# Patient Record
Sex: Female | Born: 1935 | Race: White | Hispanic: No
Health system: Southern US, Community
[De-identification: ages and names within clinical notes are randomized; demographics above are authoritative.]

---

## 2004-01-25 ENCOUNTER — Ambulatory Visit: Payer: Self-pay | Admitting: Internal Medicine

## 2004-01-27 ENCOUNTER — Ambulatory Visit: Payer: Self-pay | Admitting: Internal Medicine

## 2004-07-30 ENCOUNTER — Ambulatory Visit: Payer: Self-pay

## 2005-01-22 ENCOUNTER — Emergency Department: Payer: Self-pay | Admitting: Internal Medicine

## 2005-09-26 ENCOUNTER — Ambulatory Visit: Payer: Self-pay | Admitting: Internal Medicine

## 2006-11-18 ENCOUNTER — Ambulatory Visit: Payer: Self-pay | Admitting: Internal Medicine

## 2007-11-19 ENCOUNTER — Ambulatory Visit: Payer: Self-pay | Admitting: Internal Medicine

## 2009-01-30 ENCOUNTER — Ambulatory Visit: Payer: Self-pay | Admitting: Internal Medicine

## 2010-02-21 ENCOUNTER — Ambulatory Visit: Payer: Self-pay | Admitting: Internal Medicine

## 2010-03-07 ENCOUNTER — Ambulatory Visit: Payer: Self-pay | Admitting: Internal Medicine

## 2012-05-24 ENCOUNTER — Inpatient Hospital Stay: Payer: Self-pay | Admitting: Specialist

## 2012-05-24 ENCOUNTER — Ambulatory Visit: Payer: Self-pay | Admitting: Orthopedic Surgery

## 2012-05-24 LAB — BASIC METABOLIC PANEL
Anion Gap: 6 — ABNORMAL LOW (ref 7–16)
Calcium, Total: 8.9 mg/dL (ref 8.5–10.1)
Chloride: 107 mmol/L (ref 98–107)
Co2: 28 mmol/L (ref 21–32)
EGFR (African American): >60
Glucose: 119 mg/dL — ABNORMAL HIGH (ref 65–99)
Osmolality: 284 (ref 275–301)
Potassium: 3.9 mmol/L (ref 3.5–5.1)

## 2012-05-24 LAB — CBC
HCT: 38.3 % (ref 35.0–47.0)
HGB: 13 g/dL (ref 12.0–16.0)
MCH: 32.7 pg (ref 26.0–34.0)
MCHC: 33.9 g/dL (ref 32.0–36.0)
Platelet: 153 10*3/uL (ref 150–440)
RBC: 3.97 10*6/uL (ref 3.80–5.20)
RDW: 13 % (ref 11.5–14.5)
WBC: 8.8 10*3/uL (ref 3.6–11.0)

## 2012-05-24 LAB — URINALYSIS, COMPLETE
Bacteria: NONE SEEN
Blood: NEGATIVE
Glucose,UR: NEGATIVE mg/dL (ref 0–75)
Leukocyte Esterase: NEGATIVE
Ph: 8 (ref 4.5–8.0)
RBC,UR: 3 /HPF (ref 0–5)
Squamous Epithelial: NONE SEEN
WBC UR: 1 /HPF (ref 0–5)

## 2012-05-24 LAB — PROTIME-INR
INR: 0.9
Prothrombin Time: 12.4 secs (ref 11.5–14.7)

## 2012-05-25 LAB — BASIC METABOLIC PANEL
Anion Gap: 6 — ABNORMAL LOW (ref 7–16)
BUN: 16 mg/dL (ref 7–18)
Calcium, Total: 7.8 mg/dL — ABNORMAL LOW (ref 8.5–10.1)
Co2: 26 mmol/L (ref 21–32)
EGFR (Non-African Amer.): >60
Glucose: 115 mg/dL — ABNORMAL HIGH (ref 65–99)
Osmolality: 278 (ref 275–301)
Potassium: 4 mmol/L (ref 3.5–5.1)
Sodium: 138 mmol/L (ref 136–145)

## 2012-05-25 LAB — CBC WITH DIFFERENTIAL/PLATELET
Basophil %: 0.3 %
HGB: 10.5 g/dL — ABNORMAL LOW (ref 12.0–16.0)
MCH: 32.5 pg (ref 26.0–34.0)
MCHC: 33.7 g/dL (ref 32.0–36.0)
MCV: 97 fL (ref 80–100)
Monocyte #: 0.7 x10 3/mm (ref 0.2–0.9)
Monocyte %: 10.9 %
Neutrophil #: 4.7 10*3/uL (ref 1.4–6.5)
Platelet: 140 10*3/uL — ABNORMAL LOW (ref 150–440)
WBC: 6.7 10*3/uL (ref 3.6–11.0)

## 2012-05-26 LAB — BASIC METABOLIC PANEL
BUN: 11 mg/dL (ref 7–18)
Chloride: 105 mmol/L (ref 98–107)
Co2: 25 mmol/L (ref 21–32)
Creatinine: 0.75 mg/dL (ref 0.60–1.30)
Glucose: 129 mg/dL — ABNORMAL HIGH (ref 65–99)
Osmolality: 273 (ref 275–301)
Potassium: 4 mmol/L (ref 3.5–5.1)

## 2012-05-26 LAB — CBC WITH DIFFERENTIAL/PLATELET
Basophil #: 0 10*3/uL (ref 0.0–0.1)
Basophil %: 0.1 %
Lymphocyte %: 9.3 %
MCV: 96 fL (ref 80–100)
Neutrophil %: 77.9 %
Platelet: 113 10*3/uL — ABNORMAL LOW (ref 150–440)
RDW: 13 % (ref 11.5–14.5)
WBC: 7.7 10*3/uL (ref 3.6–11.0)

## 2012-05-26 LAB — URINE CULTURE

## 2012-05-27 LAB — CBC WITH DIFFERENTIAL/PLATELET
Eosinophil #: 0.1 10*3/uL (ref 0.0–0.7)
Lymphocyte #: 1.1 10*3/uL (ref 1.0–3.6)
MCH: 32.3 pg (ref 26.0–34.0)
MCHC: 33.4 g/dL (ref 32.0–36.0)
MCV: 97 fL (ref 80–100)
Neutrophil #: 5 10*3/uL (ref 1.4–6.5)
Neutrophil %: 70.7 %
Platelet: 102 10*3/uL — ABNORMAL LOW (ref 150–440)
RBC: 2.63 10*6/uL — ABNORMAL LOW (ref 3.80–5.20)
RDW: 13.2 % (ref 11.5–14.5)

## 2012-06-01 ENCOUNTER — Encounter: Payer: Self-pay | Admitting: Internal Medicine

## 2012-06-13 ENCOUNTER — Encounter: Payer: Self-pay | Admitting: Internal Medicine

## 2012-06-16 LAB — CBC WITH DIFFERENTIAL/PLATELET
Basophil #: 0 10*3/uL (ref 0.0–0.1)
Eosinophil %: 3.7 %
Lymphocyte #: 0.9 10*3/uL — ABNORMAL LOW (ref 1.0–3.6)
Lymphocyte %: 15.9 %
MCHC: 33.2 g/dL (ref 32.0–36.0)
Monocyte #: 0.9 x10 3/mm (ref 0.2–0.9)
Monocyte %: 15.8 %
Neutrophil %: 64.1 %
Platelet: 256 10*3/uL (ref 150–440)
RBC: 3.28 10*6/uL — ABNORMAL LOW (ref 3.80–5.20)
RDW: 14.8 % — ABNORMAL HIGH (ref 11.5–14.5)
WBC: 5.7 10*3/uL (ref 3.6–11.0)

## 2012-06-16 LAB — BASIC METABOLIC PANEL
Anion Gap: 4 — ABNORMAL LOW (ref 7–16)
Calcium, Total: 7.9 mg/dL — ABNORMAL LOW (ref 8.5–10.1)
Creatinine: 0.73 mg/dL (ref 0.60–1.30)
Glucose: 119 mg/dL — ABNORMAL HIGH (ref 65–99)
Osmolality: 274 (ref 275–301)
Potassium: 3.7 mmol/L (ref 3.5–5.1)

## 2012-06-16 LAB — CLOSTRIDIUM DIFFICILE BY PCR

## 2012-07-30 ENCOUNTER — Ambulatory Visit: Payer: Self-pay | Admitting: Physician Assistant

## 2012-09-03 ENCOUNTER — Encounter: Payer: Self-pay | Admitting: Specialist

## 2012-09-13 ENCOUNTER — Encounter: Payer: Self-pay | Admitting: Specialist

## 2012-10-13 ENCOUNTER — Encounter: Payer: Self-pay | Admitting: Specialist

## 2012-12-21 ENCOUNTER — Other Ambulatory Visit: Payer: Self-pay

## 2012-12-21 LAB — LIPID PANEL
HDL Cholesterol: 74 mg/dL — ABNORMAL HIGH (ref 40–60)
Triglycerides: 75 mg/dL (ref 0–200)
VLDL Cholesterol, Calc: 15 mg/dL (ref 5–40)

## 2012-12-21 LAB — COMPREHENSIVE METABOLIC PANEL
Albumin: 3.7 g/dL (ref 3.4–5.0)
Alkaline Phosphatase: 144 U/L — ABNORMAL HIGH (ref 50–136)
Anion Gap: 2 — ABNORMAL LOW (ref 7–16)
BUN: 19 mg/dL — ABNORMAL HIGH (ref 7–18)
Calcium, Total: 8.8 mg/dL (ref 8.5–10.1)
EGFR (African American): >60
Glucose: 89 mg/dL (ref 65–99)
Osmolality: 276 (ref 275–301)
SGPT (ALT): 14 U/L (ref 12–78)
Sodium: 137 mmol/L (ref 136–145)

## 2012-12-21 LAB — CBC WITH DIFFERENTIAL/PLATELET
Basophil #: 0.1 10*3/uL (ref 0.0–0.1)
Basophil %: 1 %
Eosinophil #: 0.2 10*3/uL (ref 0.0–0.7)
Eosinophil %: 3.5 %
HCT: 37.4 % (ref 35.0–47.0)
Lymphocyte #: 1.6 10*3/uL (ref 1.0–3.6)
MCHC: 34.2 g/dL (ref 32.0–36.0)
Monocyte %: 8 %
Neutrophil #: 3.6 10*3/uL (ref 1.4–6.5)
Neutrophil %: 60 %
Platelet: 175 10*3/uL (ref 150–440)
RBC: 4.05 10*6/uL (ref 3.80–5.20)
RDW: 15.3 % — ABNORMAL HIGH (ref 11.5–14.5)

## 2012-12-21 LAB — TSH: Thyroid Stimulating Horm: 0.865 u[IU]/mL

## 2013-05-11 ENCOUNTER — Emergency Department: Payer: Self-pay | Admitting: Emergency Medicine

## 2014-07-03 IMAGING — CR DG FEMUR 2V*R*
1 series · 4 of 4 positions shown · non-contrast
Comparison: none

REASON FOR EXAM: fall, right thigh pain
COMMENTS:

PROCEDURE:     DXR - DXR FEMUR RIGHT  - May 24, 2012  [DATE]
RESULT:     Angulated medially comminuted fracture the distal femoral shaft
is present. Prior tibial surgery.

[Series 1: ap · 0.17mm/px · 4 of 4 slices shown]
[im 1/4]
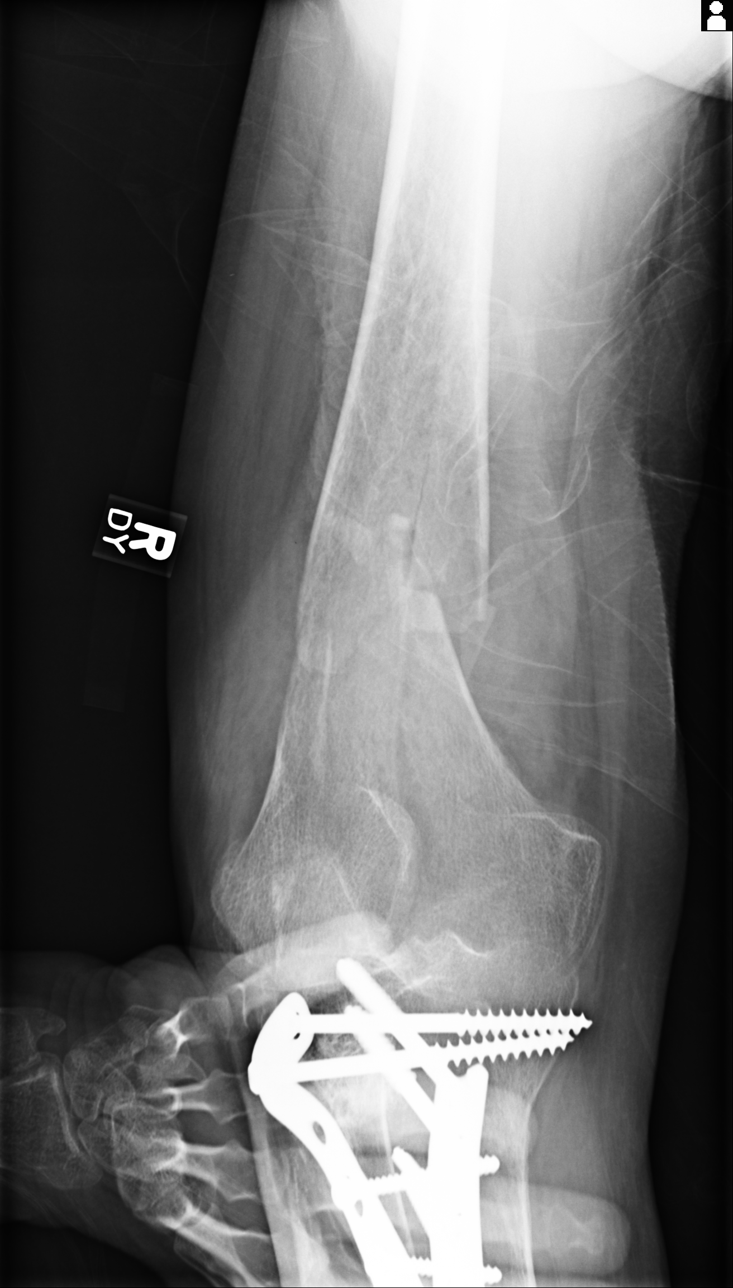
[im 2/4]
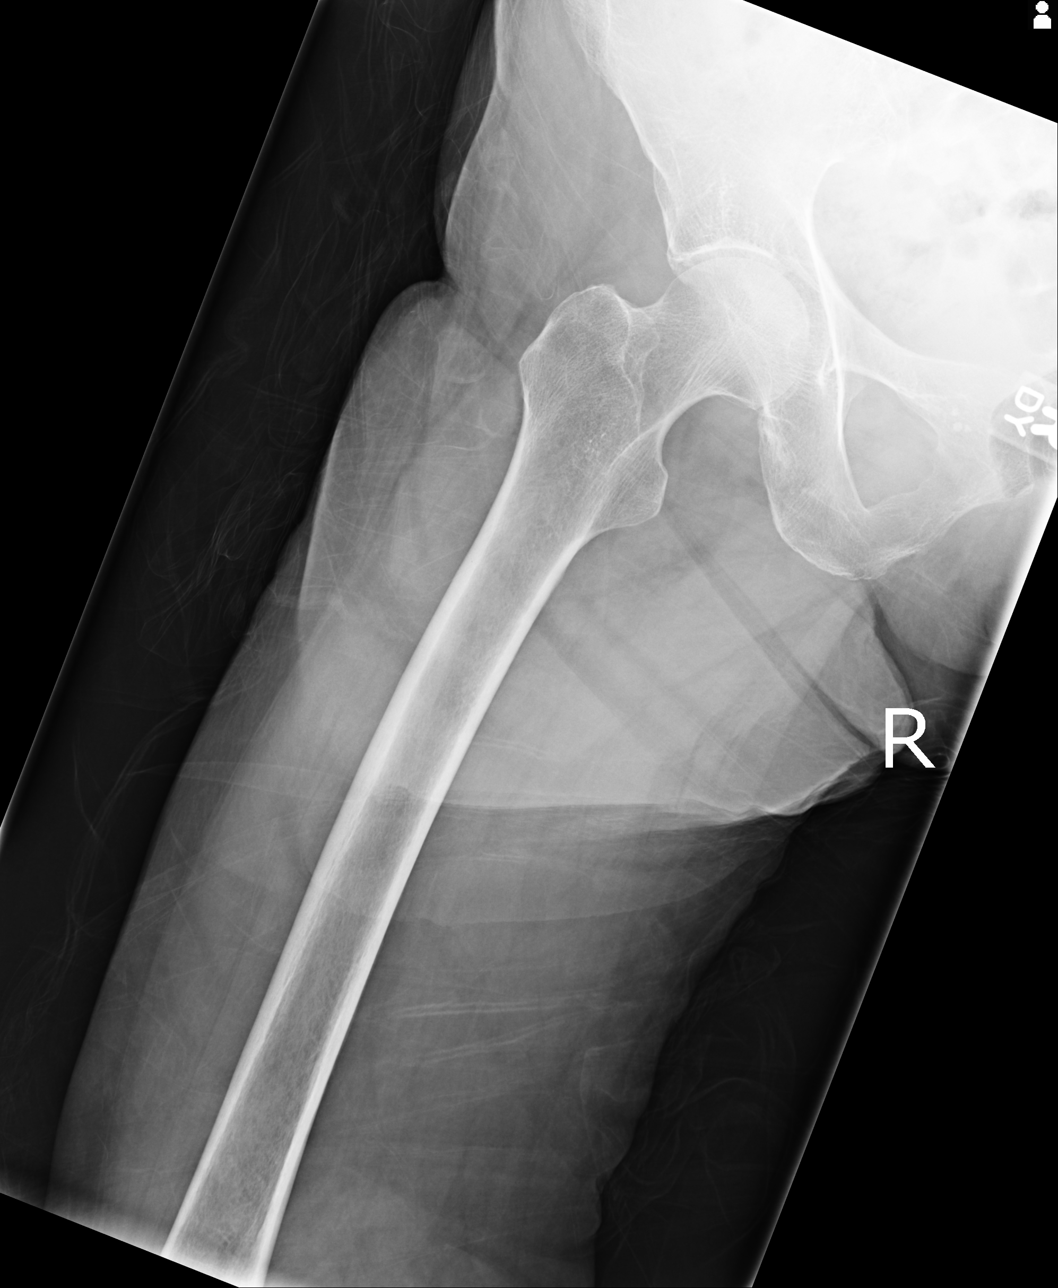
[im 3/4]
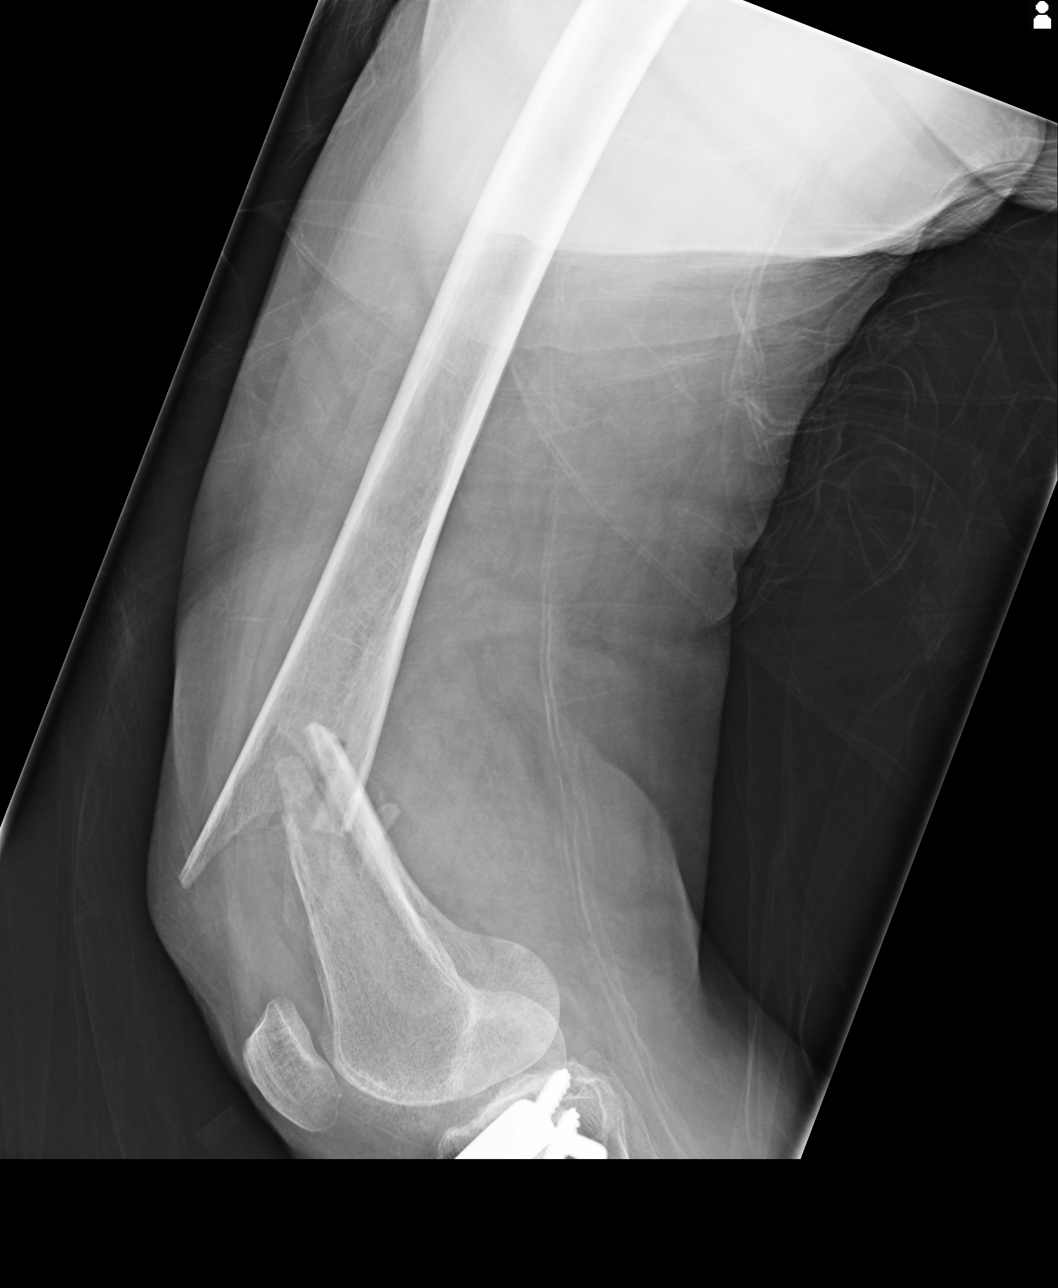
[im 4/4]
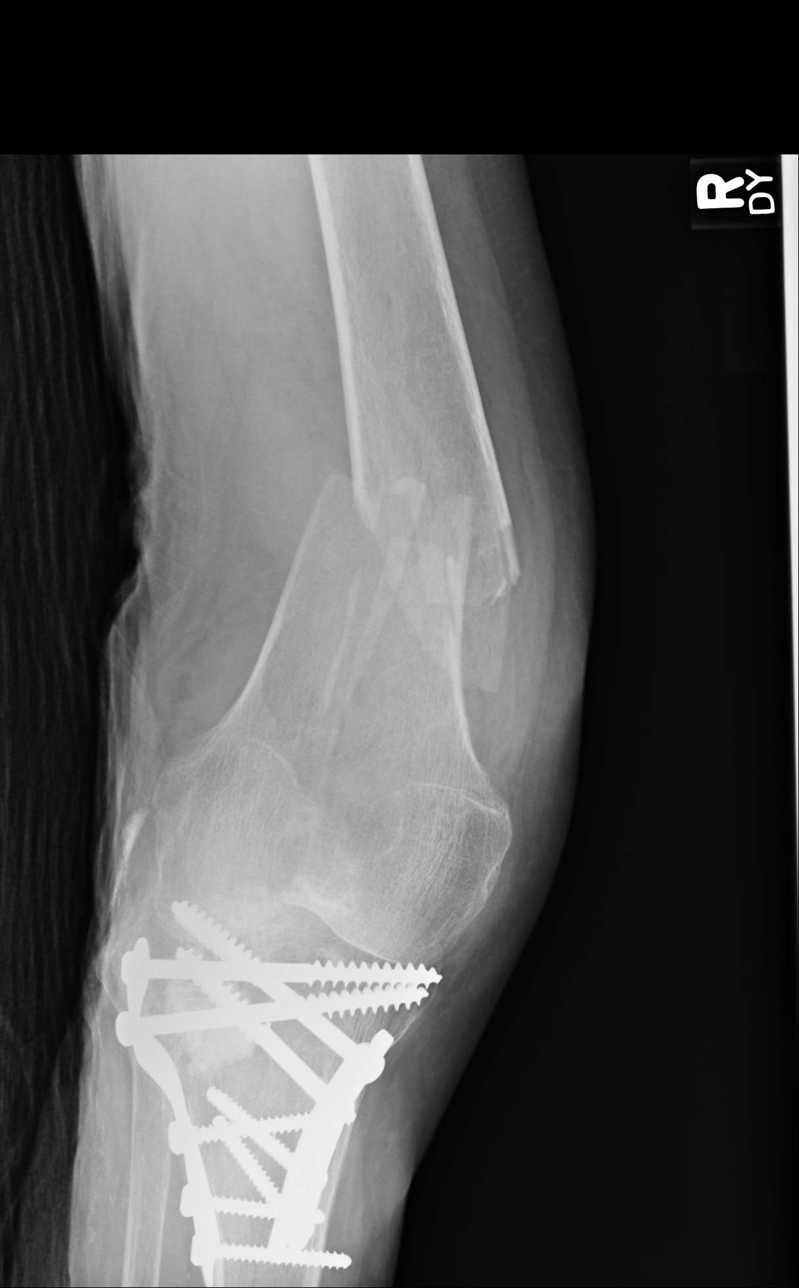

[4 of 4 positions shown; findings below may reference images not displayed]

IMPRESSION: Severe angulated comminuted distal femoral shaft fracture.

## 2014-07-04 IMAGING — CR DG FEMUR 2V*R*
1 series · 4 of 4 positions shown · non-contrast
Comparison: none

REASON FOR EXAM: post op
COMMENTS:

PROCEDURE:     DXR - DXR FEMUR RIGHT  - May 25, 2012  [DATE]
RESULT:     Comparison: 05/24/2012

[Series 1: ap · 0.17mm/px · 4 of 4 slices shown]
[im 1/4]
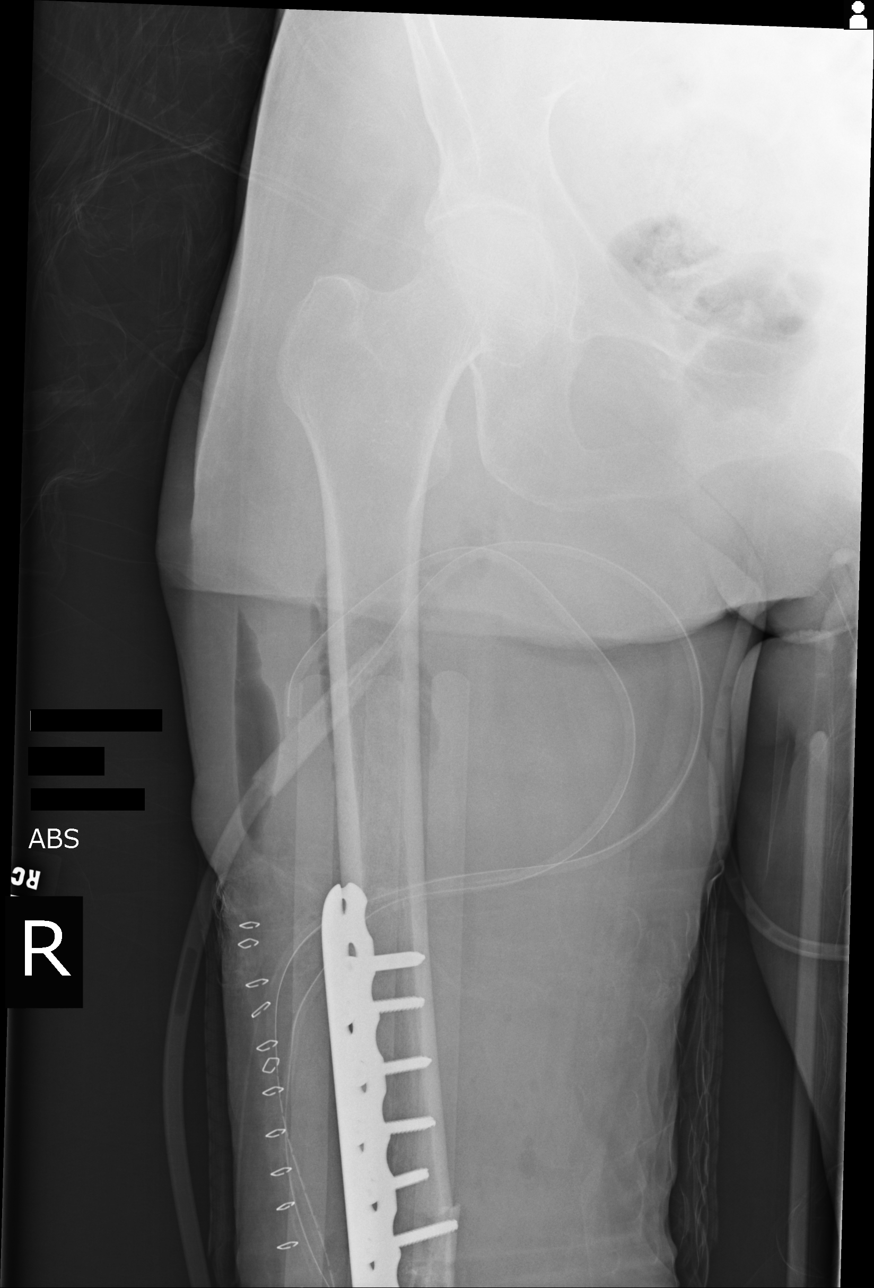
[im 2/4]
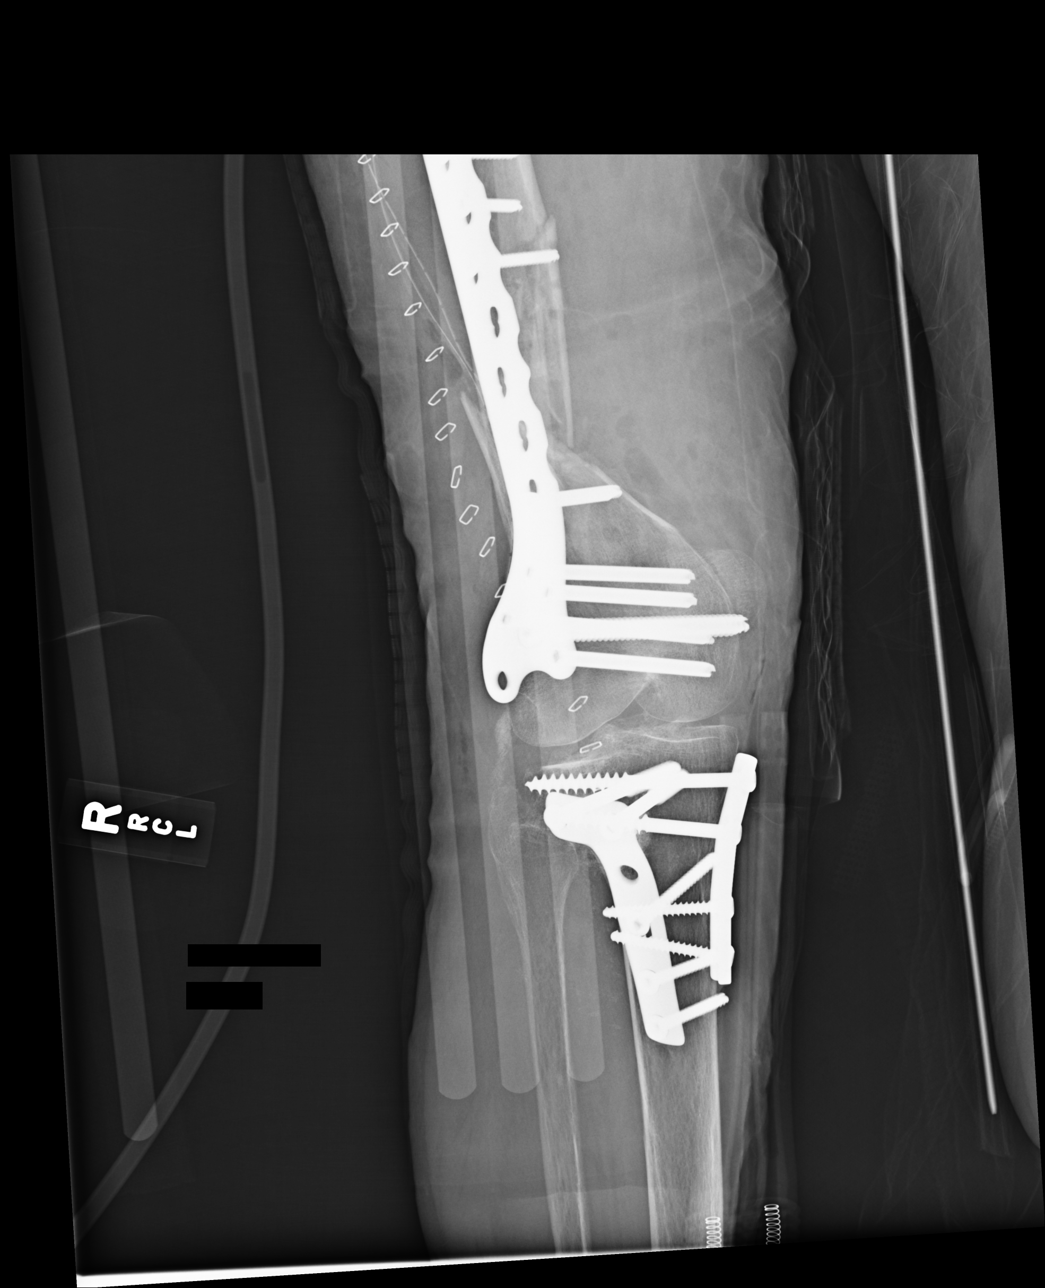
[im 3/4]
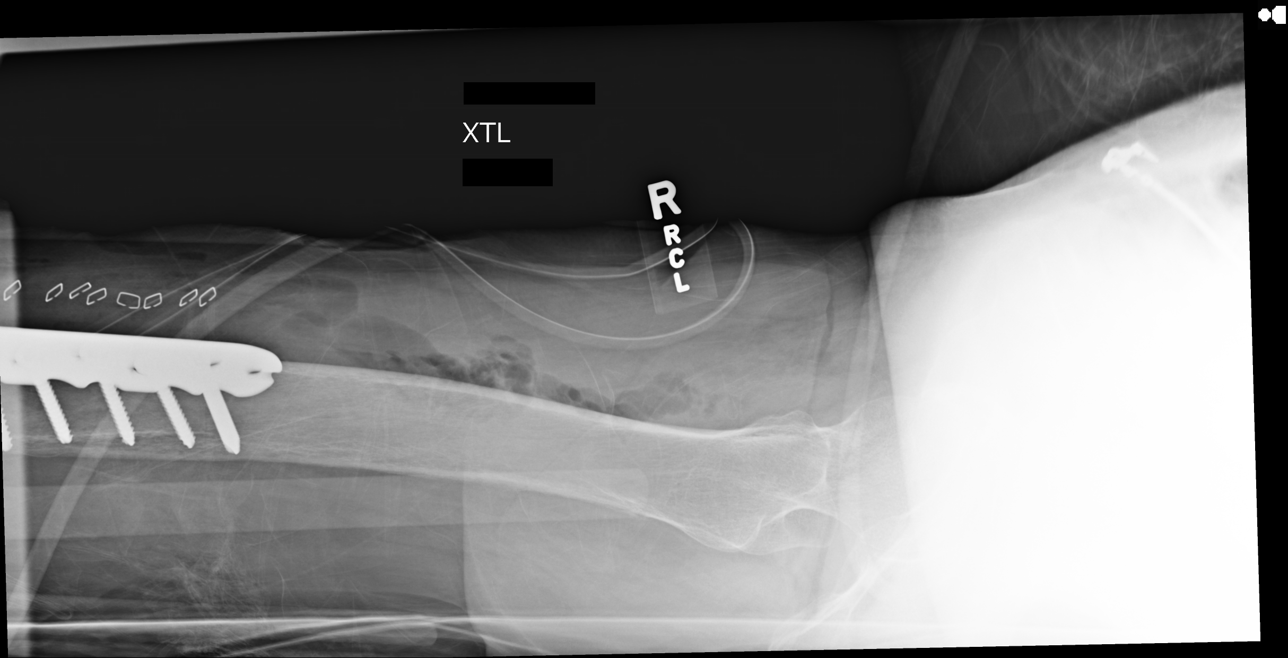
[im 4/4]
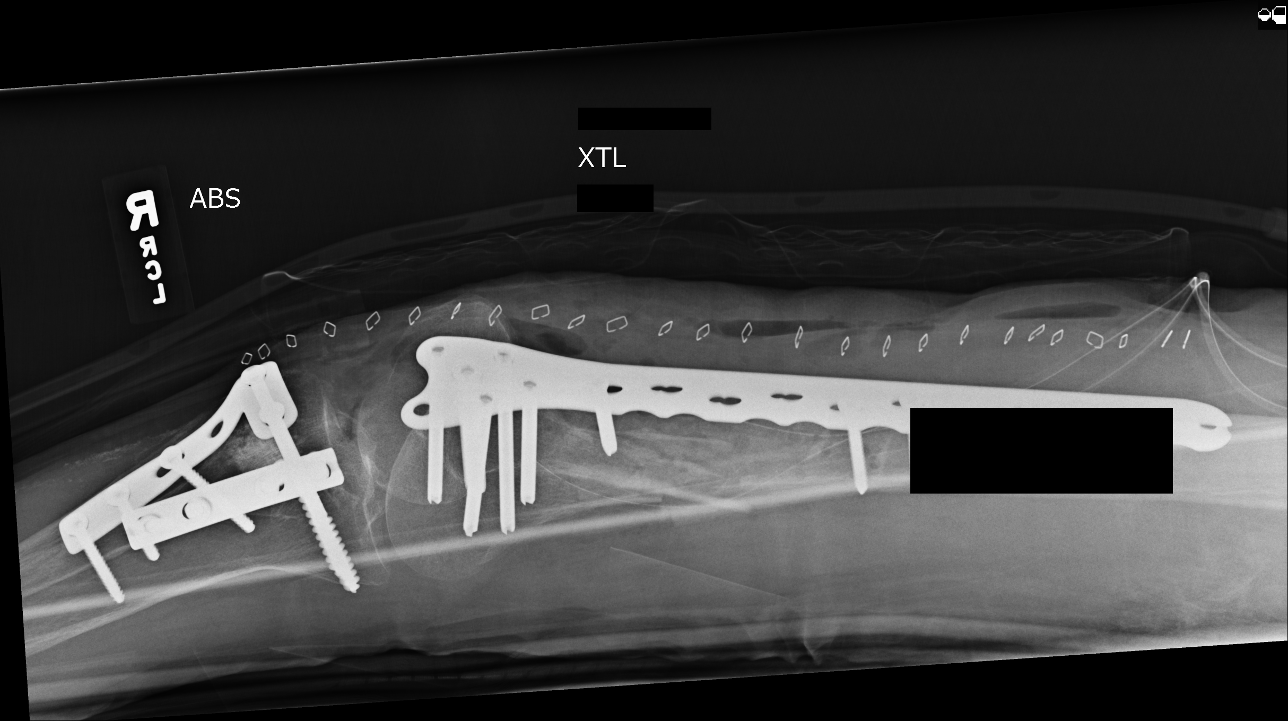

[4 of 4 positions shown; findings below may reference images not displayed]

FINDINGS: Lateral cortical position plate and screw seen traversing the distal femur
fracture. Internal fixation hardware demonstrated in the proximal tibia as
well. Skin staples and surgical drain are present. Soft tissue gas is likely
postoperative.
IMPRESSION: ORIF of the distal femur fracture.

[REDACTED]

## 2014-07-04 IMAGING — CR DG C-ARM 1-60 MIN
3 series · 3 of 3 positions shown · non-contrast
Comparison: none

REASON FOR EXAM: distal femur Fracture reduction/fixation
COMMENTS:

PROCEDURE:     DXR - DXR C-ARM WITH SPOT IMAGES  - May 25, 2012  [DATE]
RESULT:     Comparison: 05/24/2012

[[id] (1 of 3)]
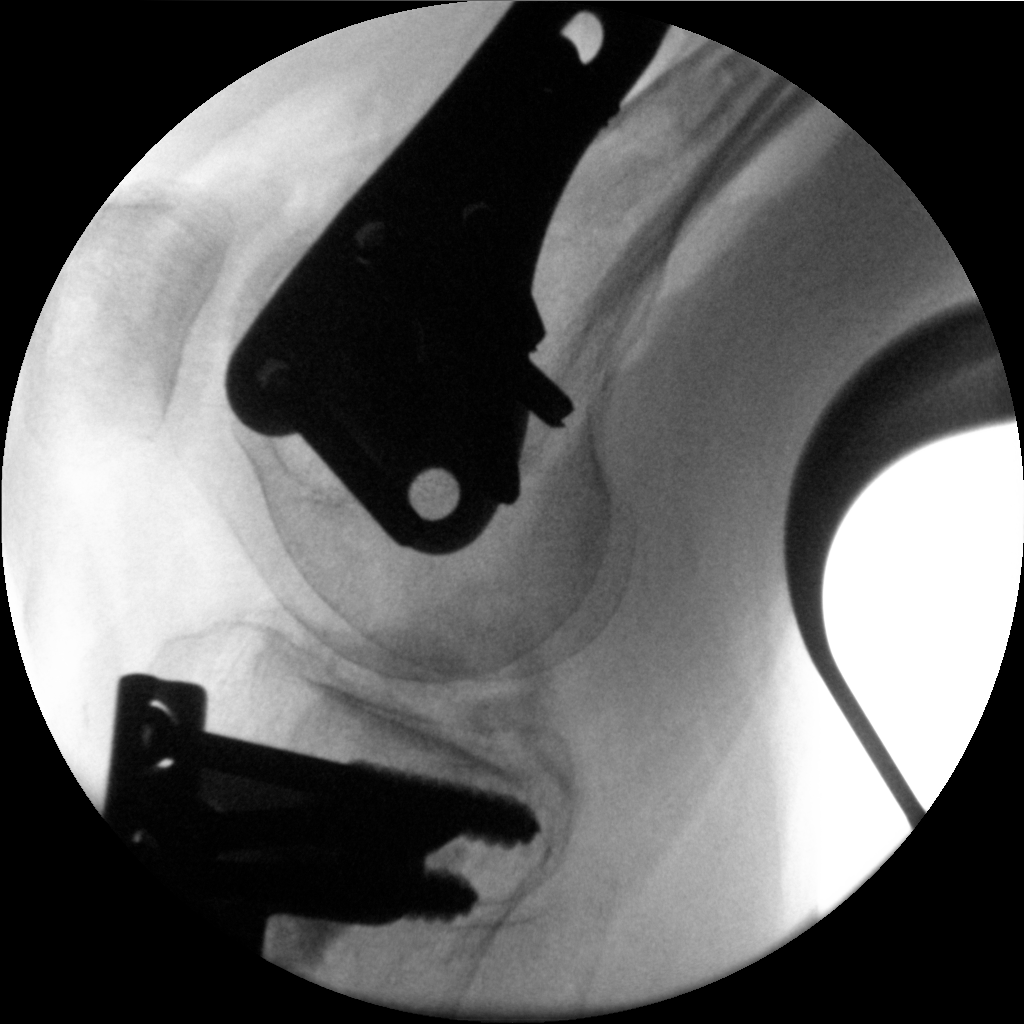

[[id] (2 of 3)]
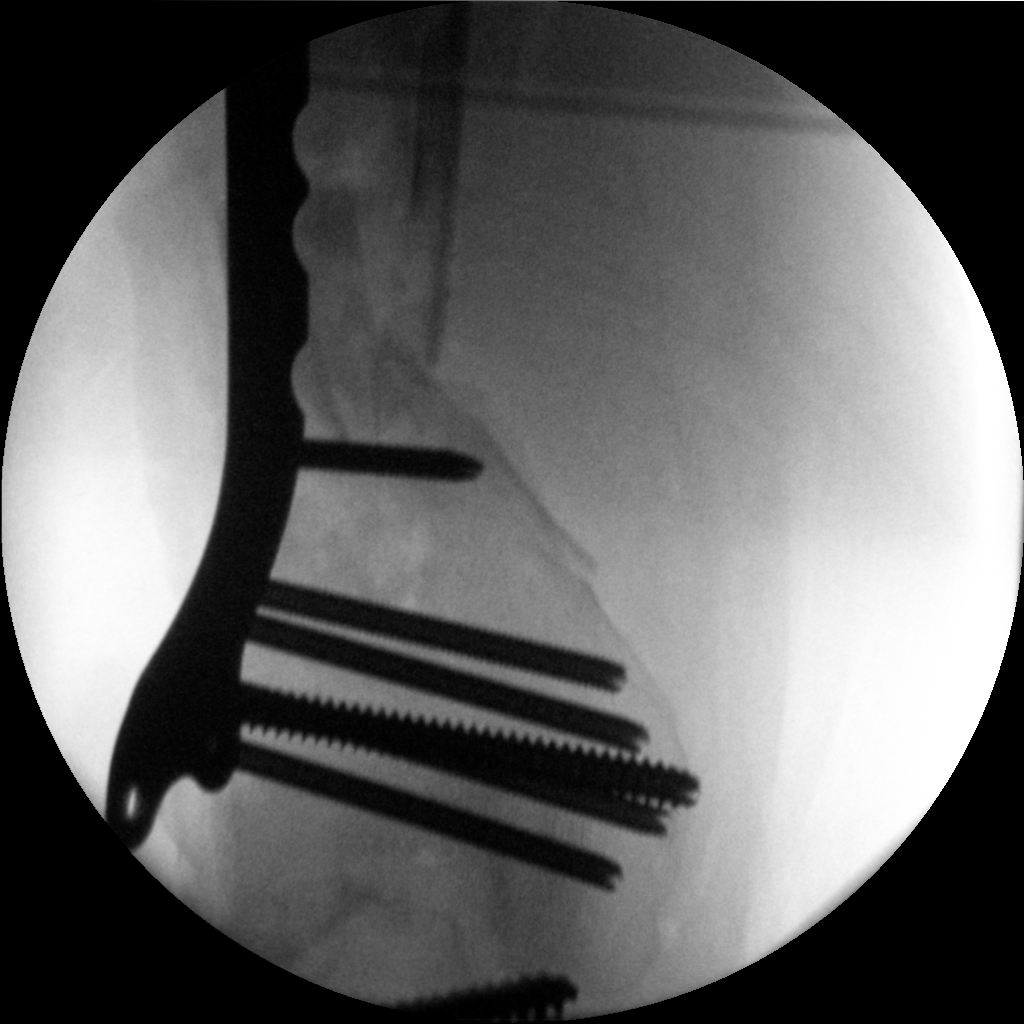

[[id] (3 of 3)]
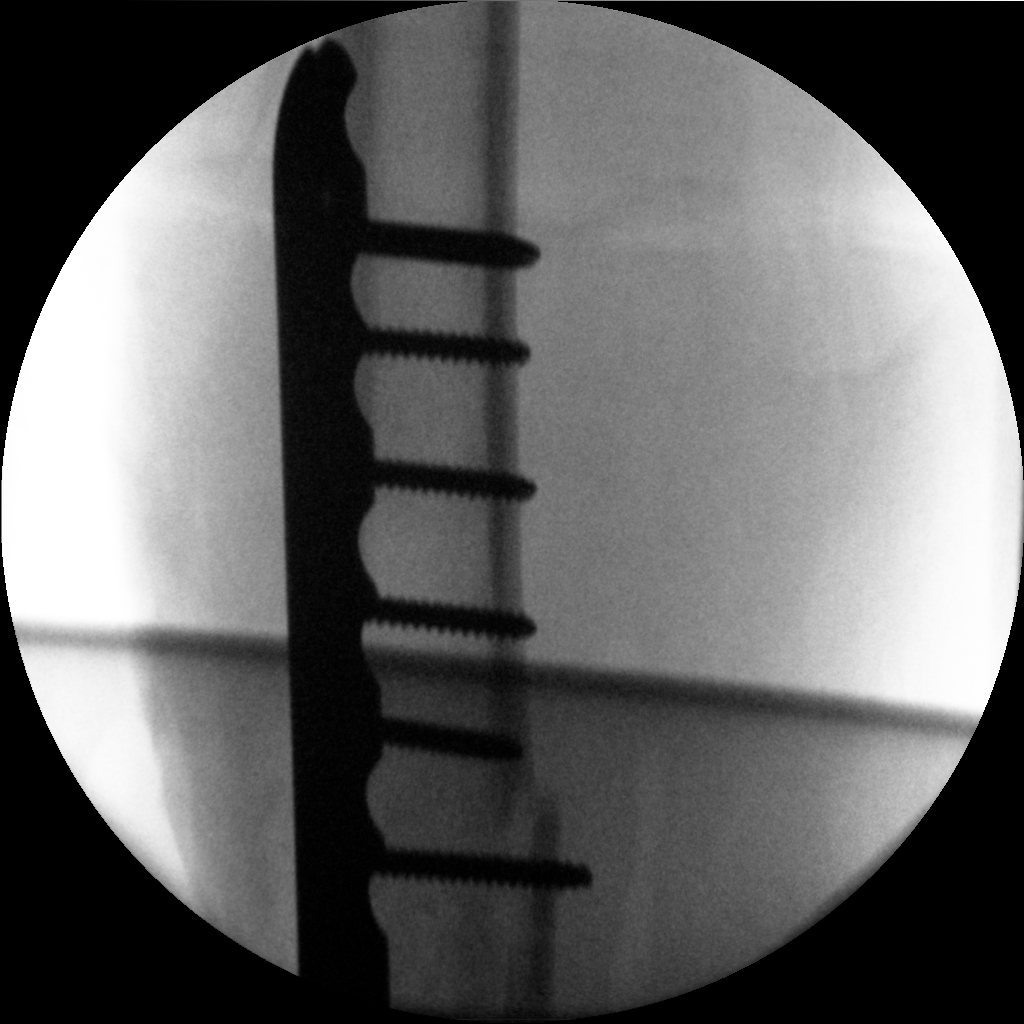

[3 of 3 positions shown; findings below may reference images not displayed]

FINDINGS: Three fluoroscopic images were submitted from intraoperative procedure.
Lateral cortical fixation plate and screws are seen traversing the distal
femur fracture. The remainder of the interpretation is deferred to the
performing clinician.
IMPRESSION: Please see above.

[REDACTED]

## 2014-08-05 NOTE — Discharge Summary (Signed)
PATIENT NAME:  [name] [name]:  [id number] DATE OF BIRTH:  Jun 02, 1928  DATE OF ADMISSION:  05/24/2012 DATE OF DISCHARGE:  05/28/2012  FINAL DIAGNOSES: 1. Comminuted displaced right distal femoral shaft fracture with intercondylar extension. 2. Osteoporosis.  OPERATIONS: On 05/25/2012, open reduction and internal fixation of right distal femur fracture with long plate and screws.   COMPLICATIONS: None.   CONSULTATIONS: Aram BeechamJeffrey Sparks, MD   DISCHARGE MEDICATIONS: 1. Benadryl p.r.n.  2. Norco 5/325 mg 1 to 2 q. 4 hours p.r.n. pain.  3. Fosamax 70 mg weekly.  4. Calcium 1 p.o. b.i.d.  5. Enteric-coated aspirin 1 p.o. b.i.d.  6. Iron 1 p.o. every day.    HISTORY OF PRESENT ILLNESS: The patient is an 81 year old female who stumbled and fell at home on the day of admission. She was brought to the Emergency Room where exam and x-rays revealed a comminuted distal femur fracture with intercondylar extension. She was admitted for operative fixation of the fracture.   PAST MEDICAL HISTORY: Illnesses as above.   MEDICATIONS: As above.   ALLERGIES: No known drug allergies.  REVIEW OF SYSTEMS: Unremarkable.   FAMILY HISTORY: Unremarkable.   SOCIAL HISTORY: The patient lives in her own home. Does not smoke or drink.   PHYSICAL EXAMINATION: The patient was alert and cooperative. Right leg was tender and swollen, distal femur. Neurovascular status was good and the skin was intact. There was pain with any movement.   LABORATORY DATA: On admission was satisfactory.   HOSPITAL COURSE: On 05/25/2012, the patient underwent open reduction and internal fixation of the right femur fracture. Postoperatively, she did well. Hemoglobin dropped to 8.5, but she was asymptomatic. She was gradually ambulated, touchdown weight-bearing only. She is stable and ready for skilled nursing transfer today. She will be seen in my office in 2 weeks for exam and x-ray.  ____________________________ Valinda HoarHoward E. Tereza Gilham,  MD hem:sb D: 05/28/2012 10:40:06 ET T: 05/28/2012 10:47:05 ET JOB#: [id number]  cc: Valinda HoarHoward E. Marla Pouliot, MD, <Dictator> Lyndon CodeFozia M. Khan, MD Valinda HoarHOWARD E Karrin Eisenmenger MD ELECTRONICALLY SIGNED 05/29/2012 15:12

## 2014-08-05 NOTE — Op Note (Signed)
PATIENT NAME:  Revonda StandardLLISON, Vicki [name] DATE OF BIRTH:  Sep 16, 1928  DATE OF PROCEDURE:  05/25/2012  PREOPERATIVE DIAGNOSIS: Comminuted, unstable, intracondylar  right distal femur fracture with displacement.   POSTOPERATIVE DIAGNOSIS: Comminuted, unstable, intracondylar  right distal femur fracture with displacement.  PROCEDURE PERFORMED: Open reduction internal fixation right distal femur fracture with intracondylar extension (Synthes 10-hole locking plate and screws).   SURGEON: Valinda HoarHoward E. Nihaal Friesen, M.D.   ANESTHESIA: Spinal.   COMPLICATIONS: None.   DRAINS: 2 Autovac.   ESTIMATED BLOOD LOSS: 100 mL   REPLACEMENT: None.   OPERATIVE PROCEDURE:  The patient was brought into the operating room, where she underwent satisfactory spinal anesthesia and was placed in the supine position on the fracture table and padded appropriately. The right leg was prepped and draped in sterile fashion and an Esmarch applied. The tourniquet was inflated to 300 mmHg. Tourniquet time was 136 minutes. A long lateral incision was made along the shaft of the femur and dissection carried down past the knee joint. Dissection was carried out sharply through subcutaneous tissue. The posterior edge of the fascial lata was divided bluntly with a finger and this was continued proximally. The quadriceps mechanism was retracted anteriorly. The wound was irrigated and old blood removed. The condyles were separated and left side was shortened. Traction was applied and clamp was applied to the condyles to hold them in place and a large Steinmann pin was placed through them to hold in place. A 10-hole Synthes periarticular locking plate was chosen and the distal portion placed against the lateral condyle. Initial pinning was done and fluoroscopy showed this to be good position. The 7.3 screw was inserted along with 4 other 5.0 locking screws. The plate was adapted to the shaft of the femur proximally and traction applied to pull  the femur out to length. This was clamped in place. Cortical screws were placed proximally to stabilize this and fluoroscopy showed the construct to be satisfactory. There was a little posterior bow, but I did not feel this would cause any problems. The additional cortical and  locking screws were inserted along the course of the plate. Several holes were left opened where the fracture was so comminuted at the distal femoral area. After stable fixation was accomplished, the wound was irrigated. The 2 Autovacs were inserted and the fascia was closed with #1 quill suture. The subcutaneous tissue was closed with 0 quill and the skin was closed with staples. Dry sterile dressing and Polar Care and knee immobilizer applied. The tourniquet was deflated with good return of blood flow to the foot. The patient was transferred to her hospital bed and taken to recovery in good condition.      ____________________________ Valinda HoarHoward E. Janean Eischen, MD hem:cc D: 05/25/2012 15:43:19 ET T: 05/25/2012 22:03:15 ET JOB#: [id number]  cc: Valinda HoarHoward E. Aarushi Hemric, MD, <Dictator> Valinda HoarHOWARD E Adin Lariccia MD ELECTRONICALLY SIGNED 05/26/2012 7:49

## 2014-08-05 NOTE — Consult Note (Signed)
PATIENT NAME:  Vicki [name], Vicki [name]:  [id number] DATE OF BIRTH:  Sep 12, 1928  DATE OF CONSULTATION:  05/24/2012  REFERRING PHYSICIAN:   CONSULTING PHYSICIAN:  Kyra SearlesJohn F. Codee Bloodworth, MD  Addendum  The patient's AP pelvis demonstrates no fracture or dislocation. There is expected abduction and external rotation of the proximal femur. There is bowel gas in the supraacetabular area which I do not think is a lytic lesion, although we will await the radiologist's interpretation.   IMPRESSION:  No evidence of pelvis fracture.    ____________________________ Kyra SearlesJohn F. An Schnabel, MD jfs:si D: 05/24/2012 20:32:17 ET T: 05/24/2012 20:48:05 ET JOB#: [id number]  cc: Kyra SearlesJohn F. Ellysia Char, MD, <Dictator> Kyra SearlesJOHN F Tytianna Greenley MD ELECTRONICALLY SIGNED 07/18/2012 13:00

## 2014-08-05 NOTE — Consult Note (Signed)
Chief Complaint:  Subjective/Chief Complaint Pt to ER with femur fx. Has hx of osteoporosis and previous right knee surgery. Denies Cardiac hx. Denies CP, SOB, palpitations, or syncope.   Brief Assessment:  Cardiac Regular  no murmur   Respiratory normal resp effort  clear BS   Gastrointestinal details normal Soft  Nontender  Bowel sounds normal   Lab Results: Routine Chem:  09-Feb-14 18:02   Glucose, Serum  119  BUN 17  Creatinine (comp) 0.80  Sodium, Serum 141  Potassium, Serum 3.9  Chloride, Serum 107  CO2, Serum 28  Anion Gap  6  eGFR (Non-African American) >60 (eGFR values <59m/min/1.73 m2 may be an indication of chronic kidney disease (CKD). Calculated eGFR is useful in patients with stable renal function. The eGFR calculation will not be reliable in acutely ill patients when serum creatinine is changing rapidly. It is not useful in  patients on dialysis. The eGFR calculation may not be applicable to patients at the low and high extremes of body sizes, pregnant women, and vegetarians.)  Routine Hem:  09-Feb-14 18:02   WBC (CBC) 8.8  Hemoglobin (CBC) 13.0  Platelet Count (CBC) 153 (Result(s) reported on 24 May 2012 at 06:40PM.)   Assessment/Plan:  Assessment/Plan:  Plan RBBB on EKG. Will begin empiric beta-blocker and check echo. Pt cleared for surgery. Will follow with you.   Electronic Signatures: SIdelle Crouch(MD)  (Signed 09-Feb-14 19:07)  Authored: Chief Complaint, Brief Assessment, Lab Results, Assessment/Plan   Last Updated: 09-Feb-14 19:07 by SIdelle Crouch(MD)

## 2014-08-05 NOTE — Consult Note (Signed)
PATIENT NAME:  Vicki [name], Vicki MR#:  [name] DATE OF BIRTH:  1929/04/06  DATE OF CONSULTATION:  05/24/2012  REFERRING PHYSICIAN: Governor Rooksebecca Lord, MD   CONSULTING PHYSICIAN:  Duane LopeJeffrey D. Judithann SheenSparks, MD FAMILY PHYSICIAN: Dr. Beverely RisenFozia Khan.   REASON FOR CONSULTATION: Preop medical clearance in a patient with femur fracture, now with abnormal EKG and hypertension.   HISTORY OF PRESENT ILLNESS: The patient is an 81 year old female with a history of osteoporosis and previous right knee surgery who fell earlier today in her kitchen injuring her right leg. She was brought to the Emergency Room where she was found to have a femur fracture and is tentatively scheduled for orthopedic surgery in the near future. The patient denies any previous cardiac history. Specifically, she has been doing well. She denies any fevers, chills, nausea, vomiting, chest pain, shortness of breath, or palpitations. No presyncope or syncope.   PAST MEDICAL HISTORY: 1. Osteoporosis.  2. Status post right knee surgery.   MEDICATIONS: 1.Biotin supplement, 1 p.o. daily.  2. Calcium supplement, 1 p.o. daily.   ALLERGIES: No known drug allergies.   SOCIAL HISTORY: The patient is married. No history of alcohol or tobacco abuse.   FAMILY HISTORY: Positive for hypertension but otherwise unremarkable.   REVIEW OF SYSTEMS:   CONSTITUTIONAL: No fever or change in weight.  EYES: No blurred or double vision. No glaucoma.  ENT: No tinnitus or hearing loss. No nasal discharge or bleeding. No difficulty swallowing.  RESPIRATORY: No cough or wheezing. Denies hemoptysis. No painful respiration.  CARDIOVASCULAR: No chest pain or orthopnea. No palpitations or syncope.   GASTROINTESTINAL: No nausea, vomiting, or diarrhea. No abdominal pain. No change in bowel habits.  GENITOURINARY: No dysuria or hematuria. No incontinence.  ENDOCRINE: No polyuria or polydipsia. No heat or cold intolerance.  HEMATOLOGIC: The patient denies anemia, easy bruising,  or bleeding.  LYMPHATIC: No swollen glands.  MUSCULOSKELETAL: The patient denies pain in her neck, back, shoulders. Does have pain in her knee and hip.  NEUROLOGIC: No numbness. Denies migraines, stroke or seizures.  PSYCHIATRIC: The patient denies anxiety, insomnia, or depression.   PHYSICAL EXAMINATION: GENERAL: The patient is well-developed, well-nourished, in no acute distress.  VITAL SIGNS: Vital signs are remarkable for a blood pressure of 165/79, with a heart rate of 70, respiratory rate of 16. She is afebrile.  HEENT: Normocephalic, atraumatic. Pupils are equally round and reactive to light and accommodation. Extraocular movements are intact. Sclerae are anicteric. Conjunctivae are clear. Oropharynx is clear.  NECK: Supple without JVD. No lymphadenopathy or thyromegaly was noted.  LUNGS: Clear to auscultation and percussion without wheezes, rales, or rhonchi. No dullness.  CARDIAC: Regular rate and rhythm with normal S1, S2. No significant rubs, murmurs, or gallops. PMI is nondisplaced. Chest wall is nontender.  ABDOMEN: Soft, nontender, with normoactive bowel sounds. No organomegaly or masses were appreciated. No hernias or bruits were noted.  EXTREMITIES: Without clubbing, cyanosis, or edema. Pulses were 2+ bilaterally.  SKIN: Warm and dry without rash or lesions.  NEUROLOGIC: Cranial nerves II through XII were grossly intact. Deep tendon reflexes were symmetric. Motor and sensory exam was nonfocal.  PSYCHIATRIC: Exam revealed a patient who was alert and oriented to person, place, and time. She was cooperative and used good judgment.   LABORATORY AND RADIOLOGICAL DATA:  EKG revealed sinus rhythm with a right bundle branch block with no acute ischemic changes. CBC was within normal limits. Chemistries revealed a glucose 119, with a BUN of 17, creatinine of 0.80, sodium 141  and potassium of 3.9 with a GFR of greater than 60.   ASSESSMENT: 1. Benign hypertension.  2. Right bundle  branch block.  3. Femur fracture.  4. Osteoporosis.   PLAN: We will begin low-dose beta blocker at this time empirically in the perioperative time. We will also check an echocardiogram because of her right bundle branch block. She is medically stable and cleared for surgery at this time. We will continue to follow this patient with you while in the hospital.   Thank you for the consultation. Please call if questions arise.   ____________________________ Duane Lope. Judithann Sheen, MD jds:cb D: 05/24/2012 19:11:43 ET T: 05/24/2012 19:18:11 ET JOB#: [id number]  cc: Duane Lope. Judithann Sheen, MD, <Dictator> Jaeven Wanzer Rodena Medin MD ELECTRONICALLY SIGNED 05/25/2012 7:46

## 2014-08-05 NOTE — H&P (Signed)
PATIENT NAME:  Vicki [name], Vicki [name]:  [id number] DATE OF BIRTH:  04/29/1928  DATE OF ADMISSION:  05/24/2012  CHIEF COMPLAINT: Right thigh pain.   HISTORY OF PRESENT ILLNESS: Vicki patient is an 81 year old [name] who sustained a fall today, mechanical fall without loss of consciousness. She was [facility] Department where she was diagnosed with a comminuted distal femur fracture for which I was asked to provide definitive consultation.   She complains of intermittent achy pain in Vicki supracondylar region of Vicki right distal thigh.   REVIEW OF SYSTEMS:   NEUROLOGIC: No loss of consciousness.  CARDIAC: No chest pain.  RESPIRATORY: No shortness of breath.  SKIN: No laceration.  GASTROINTESTINAL: No nausea, vomiting, diarrhea.  GENITOURINARY: No dysuria.  MUSCULOSKELETAL: Complains of right thigh pain only.   All other review of systems were negative.   PAST MEDICAL HISTORY: Remarkable for osteoporosis MEDICATIONS: Calcium and Fosamax.   PAST SURGICAL HISTORY: Remarkable for open reduction and internal fixation of bicondylar right tibial plateau fracture.   ALLERGIES: No known drug allergies.   SOCIAL HISTORY: Lives at home with her husband of 59 years. Nonsmoker.   FAMILY HISTORY: Noncontributory for coronary artery disease and diabetes.   PHYSICAL EXAMINATION:  GENERAL: Pleasant, alert, elderly [name], presenting with her husband.  PSYCHIATRIC: Mood and affect appropriate.  VITAL SIGNS: On presentation to [facility] Emergency Department demonstrate temperature of 97.5, blood pressure of 165/79, respirations 16, pulse of 78 and 100% room air saturation.  HEENT: Normocephalic, atraumatic. Sclerae clear. Oral mucosa membranes are slightly dry.  LYMPHATIC: Moderate swelling in Vicki right distal thigh.  SKIN: Warm and intact over Vicki right thigh.  VASCULAR: Dorsalis pedis 1+ and 2+ posterior tibialis pulse right lower extremity.  LUNGS: Clear to  auscultation bilaterally.  HEART: Regular rate and rhythm. Normal S1 and S2. No murmurs or gallops.  ABDOMEN: Soft, nontender and nondistended. Positive bowel sounds.  MUSCULOSKELETAL: Bilateral upper extremities and left lower extremity nontender to palpation and without deformity. Examination of right lower extremity reveals severe flexion deformity and external rotation deformity of Vicki right lower extremity. Focal tenderness to palpation in Vicki supracondylar region of Vicki right distal femur. Remainder of Vicki ipsilateral lower extremity is nontender to palpation. Vicki thigh musculature is soft, as is Vicki calf. Vicki pelvis is stable to AP and lateral compression. Vicki lumbar spine is nontender to palpation.   RADIOGRAPHS: Vicki right femur reveals a comminuted supracondylar distal femur fracture with intracondylar extension. CT of Vicki right distal femur reveals displacement of Vicki intracondylar split, though no coronal plane fracture noted, and highly comminuted metaphyseal distal femur fracture.   LABORATORY: Evaluation on admission to [facility] hospital reveals chemistries are unremarkable and hemoglobin and hematocrit normal at 13 and 38.3, platelets 153,000. PT-INR is pending. UA is pending.   IMPRESSION: Closed displaced right distal femur supracondylar/intercondylar fracture.   PLAN: TED and sequential noninjured lower extremity, 5 pounds Buck's traction, pain control and plan for surgery tomorrow with Dr. Deeann SaintHoward Miller which Vicki patient has requested. I have discussed both limited open reduction and screw fixation with retrograde IM nail versus plate and screw construct. They will discuss this with Dr. Hyacinth MeekerMiller tomorrow. Vicki x-rays were reviewed with her husband. I will also telephone Dr. Hyacinth MeekerMiller this evening.   ____________________________ Kyra SearlesJohn F. Marsean Elkhatib, MD jfs:gb D: 05/24/2012 19:54:53 ET T: 05/24/2012 20:09:29 ET JOB#: [id number]  cc: Kyra SearlesJohn F. Jaggar Benko, MD, <Dictator> Kyra SearlesJOHN F Shanie Mauzy MD ELECTRONICALLY  SIGNED 05/24/2012 20:32

## 2014-08-05 NOTE — Consult Note (Signed)
Brief Consult Note: Diagnosis: closed displaced R distal femur intracond/supracond femur fx.   Patient was seen by consultant.   Consult note dictated.   Recommend to proceed with surgery or procedure.   Orders entered.   Discussed with Attending MD.   Comments: plan: traction, ted/scd, on call for OR tomorrow options d/w pt including perc screw fixation intracond split with retrograde IMN vs plate/screw construct.  Electronic Signatures: Kyra SearlesSloboda, Roderic Lammert F (MD)  (Signed 09-Feb-14 19:19)  Authored: Brief Consult Note   Last Updated: 09-Feb-14 19:19 by Kyra SearlesSloboda, Kashius Dominic F (MD)

## 2023-03-16 DEATH — deceased
